# Patient Record
Sex: Female | Born: 1980 | Race: Black or African American | Hispanic: No | Marital: Married | Smoking: Never smoker
Health system: Southern US, Community
[De-identification: ages and names within clinical notes are randomized; demographics above are authoritative.]

## PROBLEM LIST (undated history)

## (undated) DIAGNOSIS — J45909 Unspecified asthma, uncomplicated: Secondary | ICD-10-CM

## (undated) DIAGNOSIS — I10 Essential (primary) hypertension: Secondary | ICD-10-CM

## (undated) DIAGNOSIS — F5102 Adjustment insomnia: Secondary | ICD-10-CM

## (undated) DIAGNOSIS — Z1231 Encounter for screening mammogram for malignant neoplasm of breast: Secondary | ICD-10-CM

---

## 2017-02-21 ENCOUNTER — Inpatient Hospital Stay
Admission: EM | Admit: 2017-02-21 | Discharge: 2017-02-24 | DRG: 776 | Disposition: A | Discharge status: Home / Self Care | Payer: BC Managed Care – PPO | Attending: Obstetrics and Gynecology | Admitting: Obstetrics and Gynecology

## 2017-02-21 DIAGNOSIS — O1415 Severe pre-eclampsia, complicating the puerperium: Secondary | ICD-10-CM | POA: Diagnosis not present

## 2017-02-21 DIAGNOSIS — O165 Unspecified maternal hypertension, complicating the puerperium: Secondary | ICD-10-CM | POA: Diagnosis present

## 2017-02-21 DIAGNOSIS — R030 Elevated blood-pressure reading, without diagnosis of hypertension: Secondary | ICD-10-CM | POA: Diagnosis not present

## 2017-02-21 DIAGNOSIS — O1495 Unspecified pre-eclampsia, complicating the puerperium: Secondary | ICD-10-CM

## 2017-02-21 LAB — PROTEIN / CREATININE RATIO, URINE: CREATININE, URINE: 42 mg/dL

## 2017-02-21 LAB — COMPREHENSIVE METABOLIC PANEL
ALT: 15 U/L (ref 14–54)
ANION GAP: 12 (ref 5–15)
AST: 15 U/L (ref 15–41)
Albumin: 2.8 g/dL — ABNORMAL LOW (ref 3.5–5.0)
Alkaline Phosphatase: 107 U/L (ref 38–126)
BUN: 9 mg/dL (ref 6–20)
CHLORIDE: 107 mmol/L (ref 101–111)
CO2: 21 mmol/L — ABNORMAL LOW (ref 22–32)
Calcium: 8.6 mg/dL — ABNORMAL LOW (ref 8.9–10.3)
Creatinine, Ser: 0.66 mg/dL (ref 0.44–1.00)
Glucose, Bld: 103 mg/dL — ABNORMAL HIGH (ref 65–99)
POTASSIUM: 3.2 mmol/L — AB (ref 3.5–5.1)
Sodium: 140 mmol/L (ref 135–145)
Total Bilirubin: 0.3 mg/dL (ref 0.3–1.2)
Total Protein: 6.6 g/dL (ref 6.5–8.1)

## 2017-02-21 LAB — CBC
HCT: 32.8 % — ABNORMAL LOW (ref 35.0–47.0)
Hemoglobin: 11.2 g/dL — ABNORMAL LOW (ref 12.0–16.0)
MCH: 32.7 pg (ref 26.0–34.0)
MCHC: 34.2 g/dL (ref 32.0–36.0)
MCV: 95.8 fL (ref 80.0–100.0)
PLATELETS: 312 10*3/uL (ref 150–440)
RBC: 3.43 MIL/uL — ABNORMAL LOW (ref 3.80–5.20)
RDW: 14.1 % (ref 11.5–14.5)
WBC: 9.5 10*3/uL (ref 3.6–11.0)

## 2017-02-21 MED ORDER — HYDRALAZINE HCL 20 MG/ML IJ SOLN
5.0000 mg | Freq: Once | INTRAMUSCULAR | Status: DC
  Filled 2017-02-21: qty 1

## 2017-02-21 MED ORDER — KETOROLAC TROMETHAMINE 30 MG/ML IJ SOLN
30.0000 mg | Freq: Three times a day (TID) | INTRAMUSCULAR | Status: DC | PRN
  Administered 2017-02-22: 30 mg via INTRAVENOUS
  Filled 2017-02-21: qty 1

## 2017-02-21 MED ORDER — LABETALOL HCL 5 MG/ML IV SOLN
20.0000 mg | INTRAVENOUS | Status: DC | PRN
  Administered 2017-02-22: 20 mg via INTRAVENOUS
  Filled 2017-02-21: qty 4
  Filled 2017-02-21: qty 8

## 2017-02-21 MED ORDER — HYDRALAZINE HCL 20 MG/ML IJ SOLN
10.0000 mg | Freq: Once | INTRAMUSCULAR | Status: AC
  Administered 2017-02-21: 10 mg via INTRAVENOUS

## 2017-02-21 MED ORDER — ACETAMINOPHEN 325 MG PO TABS
650.0000 mg | ORAL_TABLET | ORAL | Status: DC | PRN
  Administered 2017-02-21 – 2017-02-22 (×2): 650 mg via ORAL
  Filled 2017-02-21 (×2): qty 2

## 2017-02-21 MED ORDER — MAGNESIUM SULFATE 40 G IN LACTATED RINGERS - SIMPLE
2.0000 g/h | INTRAVENOUS | Status: DC
  Administered 2017-02-21 – 2017-02-22 (×2): 2 g/h via INTRAVENOUS

## 2017-02-21 MED ORDER — LABETALOL HCL 5 MG/ML IV SOLN
20.0000 mg | Freq: Once | INTRAVENOUS | Status: AC
  Administered 2017-02-21: 20 mg via INTRAVENOUS
  Filled 2017-02-21: qty 4

## 2017-02-21 MED ORDER — ALBUTEROL SULFATE (2.5 MG/3ML) 0.083% IN NEBU: 2.5000 mg | INHALATION_SOLUTION | Freq: Four times a day (QID) | RESPIRATORY_TRACT | Status: DC | PRN

## 2017-02-21 MED ORDER — MAGNESIUM SULFATE BOLUS VIA INFUSION
4.0000 g | Freq: Once | INTRAVENOUS | Status: AC
  Administered 2017-02-21: 4 g via INTRAVENOUS
  Filled 2017-02-21: qty 500

## 2017-02-21 MED ORDER — LABETALOL HCL 5 MG/ML IV SOLN
20.0000 mg | Freq: Once | INTRAVENOUS | Status: AC
  Administered 2017-02-22: 20 mg via INTRAVENOUS
  Filled 2017-02-21: qty 4

## 2017-02-21 MED ORDER — LABETALOL HCL 5 MG/ML IV SOLN: 20.0000 mg | INTRAVENOUS | Status: DC | PRN

## 2017-02-21 MED ORDER — MAGNESIUM SULFATE 40 G IN LACTATED RINGERS - SIMPLE
2.0000 g/h | INTRAVENOUS | Status: DC
  Administered 2017-02-21: 2 g/h via INTRAVENOUS
  Filled 2017-02-21: qty 500

## 2017-02-21 MED ORDER — LABETALOL HCL 5 MG/ML IV SOLN: 10.0000 mg | Freq: Once | INTRAVENOUS | Status: DC

## 2017-02-21 NOTE — ED Provider Notes (Signed)
[facility] Emergency Department Provider Note  ____________________________________________  Time seen: Approximately 6:57 PM  I have reviewed the triage vital signs and the nursing notes.   HISTORY  Chief Complaint Hypertension   HPI Joneen BoersValencia G Bill is a 36 y.o. female G1P1 now 8 days pot c-section complicated by pre-eclampsia with severe features requiring 24 hours of IV magnesium who presents for evaluation of elevated blood pressure. Patient denies any prior history of high blood pressure. She has been started on amlodipine postdelivery and had hydrochlorothiazide added yesterday. She reports that her blood pressure has been persistently elevated and trending up since she went home from the hospital. Today systolics in the 160s has been the highest it ever been. She called her ob who recommended patient come to the ED for further evaluation. Patient denies HA, visual changes or floaters, abdominal pain.   History reviewed. No pertinent past medical history.  There are no active problems to display for this patient.   History reviewed. No pertinent surgical history.  Prior to Admission medications   Not on File    Allergies Patient has no known allergies.  No family history on file.  Social History Social History  Substance Use Topics  . Smoking status: Not on file  . Smokeless tobacco: Not on file  . Alcohol use Not on file    Review of Systems  Constitutional: Negative for fever. + elevated BP Eyes: Negative for visual changes. ENT: Negative for sore throat. Neck: No neck pain  Cardiovascular: Negative for chest pain. Respiratory: Negative for shortness of breath. Gastrointestinal: Negative for abdominal pain, vomiting or diarrhea. Genitourinary: Negative for dysuria. Musculoskeletal: Negative for back pain. Skin: Negative for rash. Neurological: Negative for headaches, weakness or numbness. Psych: No SI or  HI  ____________________________________________   PHYSICAL EXAM:  VITAL SIGNS: ED Triage Vitals  Enc Vitals Group     BP 02/21/17 1714 (!) 176/98     Pulse Rate 02/21/17 1714 (!) 113     Resp 02/21/17 1714 18     Temp 02/21/17 1714 98.2 F (36.8 C)     Temp Source 02/21/17 1714 Oral     SpO2 02/21/17 1714 99 %     Weight 02/21/17 1715 300 lb (136.1 kg)     Height 02/21/17 1715 5\' 5"  (1.651 m)     Head Circumference --      Peak Flow --      Pain Score --      Pain Loc --      Pain Edu? --      Excl. in GC? --     Constitutional: Alert and oriented. Well appearing and in no apparent distress. HEENT:      Head: Normocephalic and atraumatic.         Eyes: Conjunctivae are normal. Sclera is non-icteric.       Mouth/Throat: Mucous membranes are moist.       Neck: Supple with no signs of meningismus. Cardiovascular: Tachycardic with regular rhythm. No murmurs, gallops, or rubs. 2+ symmetrical distal pulses are present in all extremities. No JVD. Respiratory: Normal respiratory effort. Lungs are clear to auscultation bilaterally. No wheezes, crackles, or rhonchi.  Gastrointestinal: Soft, non tender, and non distended with positive bowel sounds. No rebound or guarding. Musculoskeletal: Nontender with normal range of motion in all extremities. No edema, cyanosis, or erythema of extremities. Neurologic: Normal speech and language. Face is symmetric. Moving all extremities. No gross focal neurologic deficits are appreciated. Skin:  Skin is warm, dry and intact. No rash noted. Psychiatric: Mood and affect are normal. Speech and behavior are normal.  ____________________________________________   LABS (all labs ordered are listed, but only abnormal results are displayed)  Labs Reviewed  CBC - Abnormal; Notable for the following:       Result Value   RBC 3.43 (*)    Hemoglobin 11.2 (*)    HCT 32.8 (*)    All other components within normal limits  COMPREHENSIVE METABOLIC PANEL -  Abnormal; Notable for the following:    Potassium 3.2 (*)    CO2 21 (*)    Glucose, Bld 103 (*)    Calcium 8.6 (*)    Albumin 2.8 (*)    All other components within normal limits  PROTEIN / CREATININE RATIO, URINE   ____________________________________________  EKG  none ____________________________________________  RADIOLOGY  none  ____________________________________________   PROCEDURES  Procedure(s) performed: None Procedures Critical Care performed:  Yes   CRITICAL CARE Performed by: Nita Sickle  ?  Total critical care time: 35 min  Critical care time was exclusive of separately billable procedures and treating other patients.  Critical care was necessary to treat or prevent imminent or life-threatening deterioration.  Critical care was time spent personally by me on the following activities: development of treatment plan with patient and/or surrogate as well as nursing, discussions with consultants, evaluation of patient's response to treatment, examination of patient, obtaining history from patient or surrogate, ordering and performing treatments and interventions, ordering and review of laboratory studies, ordering and review of radiographic studies, pulse oximetry and re-evaluation of patient's condition.  ____________________________________________   INITIAL IMPRESSION / ASSESSMENT AND PLAN / ED COURSE  36 y.o. female G1P1 now 8 days pot c-section complicated by pre-eclampsia with severe features requiring 24 hours of IV magnesium who presents for evaluation of elevated blood pressure.labs showing no evidence of help syndrome. The patient is neurologically intact at this time. Her blood pressure is 176/98. I will give her 20 mg of IV labetalol since patient is also tachycardic with a pulse of 113, start patient on mag bolus and drip. I discussed with Dr. Feliberto Gottron who will admit patient to L&D for further management of pre-eclampsia       As  part of my medical decision making, I reviewed the following data within the electronic MEDICAL RECORD NUMBER Nursing notes reviewed and incorporated, Labs reviewed , Old chart reviewed, Discussed with admitting physician , Notes from prior ED visits and [place] Controlled Substance Database    Pertinent labs & imaging results that were available during my care of the patient were reviewed by me and considered in my medical decision making (see chart for details).    ____________________________________________   FINAL CLINICAL IMPRESSION(S) / ED DIAGNOSES  Final diagnoses:  Pre-eclampsia in postpartum period      NEW MEDICATIONS STARTED DURING THIS VISIT:  New Prescriptions   No medications on file     Note:  This document was prepared using Dragon voice recognition software and may include unintentional dictation errors.    Nita Sickle, MD 02/21/17 [phone]

## 2017-02-21 NOTE — ED Triage Notes (Signed)
Pt had baby a week ago, states BP high in hospital after delivery. Pt to doctor for followup yesterday and BP still high and prescribed BP medications. Pt went back today and BP continues to be very elevated. Denies headache, blurry visison. Pt also c/o left sided hand swelling that she noticed today. Pt states she had IV infiltrate to left hand while in hospital, doctor concerned for blood clot. Pt alert and oriented X4, active, cooperative, pt in NAD. RR even and unlabored, color WNL.

## 2017-02-21 NOTE — H&P (Signed)
Regina Garcia is a 36 y.o. female presenting for elevated BP PP [facility]  8 days previously Regina Garcia is a 36 y.o. [id number] who is POD8 status post urgent pLTC-section at [id number] for non reassuring FHR   Her pregnancy was complicated by asthma (mild, persistent), obesity (BMI 54), cHTN with superimposed prex with severe features, AMA.   Delivery complicated by hypotension requiring phenylephrine, with low APGARs in neonate.She underwent 24 hrs pp mag for superimposed PreE.  Sh ef/up with her doctors 4 days ago for BP check and procardia xl was switched to Amlodipine 5 mg and 12.5 HCTZ . On recheck today BP still elevated that prompted an ED visit  She denies Vision change and no H/A. Baby is doing well . Mother just started breast feeding today      OB History    No data available     PMHX : prior syncopal episode 2 yrs ago with negative w/up . Obesity . Asthma. Pshx LTCS   Family History:+ HTN , cva , DM    Social History:  has no tobacco, alcohol, and drug history on file.  Allergies : NKDA   Medication :  Motrin 800 mg q 8 hrs prn pain  Amlodipine 5 mg q d  lovenox 60 mg sq daily  HCTZ 12.5 mg daily      ROS  general : + Obesity HEENT : no hearing loss, no vision change  Endocrine : thyroid disease , weight loss CV : no chest pain  Pulm ; + asthma  GI : no diarrhea , constipation  GU: no pelvic infection  M/s : no weakness Neuro : no seizure activity + syncopal episode   History   Blood pressure (!) 162/90, pulse 81, temperature 98.2 F (36.8 C), temperature source Oral, resp. rate 18, height 5\' 5"  (1.651 m), weight 136.1 kg (300 lb), SpO2 100 %. Exam WDWN obese black female in NAD  Max BP in ED 176/98   Physical Exam   Exam WDWN obese black female in NAD   perrla  Lungs CTA  CV RRR  Abd : soft Nt  Incision healing well , no d/c or erythema  Pelvic deferred  Neuro : 4+/4 patella dtR   Results for orders placed or performed during the  hospital encounter of 02/21/17 (from the past 24 hour(s))  CBC     Status: Abnormal   Collection Time: 02/21/17  5:22 PM  Result Value Ref Range   WBC 9.5 3.6 - 11.0 K/uL   RBC 3.43 (L) 3.80 - 5.20 MIL/uL   Hemoglobin 11.2 (L) 12.0 - 16.0 g/dL   HCT 45.432.8 (L) 09.835.0 - 11.947.0 %   MCV 95.8 80.0 - 100.0 fL   MCH 32.7 26.0 - 34.0 pg   MCHC 34.2 32.0 - 36.0 g/dL   RDW 14.714.1 82.911.5 - 56.214.5 %   Platelets 312 150 - 440 K/uL  Comprehensive metabolic panel     Status: Abnormal   Collection Time: 02/21/17  5:22 PM  Result Value Ref Range   Sodium 140 135 - 145 mmol/L   Potassium 3.2 (L) 3.5 - 5.1 mmol/L   Chloride 107 101 - 111 mmol/L   CO2 21 (L) 22 - 32 mmol/L   Glucose, Bld 103 (H) 65 - 99 mg/dL   BUN 9 6 - 20 mg/dL   Creatinine, Ser 1.300.66 0.44 - 1.00 mg/dL   Calcium 8.6 (L) 8.9 - 10.3 mg/dL   Total Protein 6.6  6.5 - 8.1 g/dL   Albumin 2.8 (L) 3.5 - 5.0 g/dL   AST 15 15 - 41 U/L   ALT 15 14 - 54 U/L   Alkaline Phosphatase 107 38 - 126 U/L   Total Bilirubin 0.3 0.3 - 1.2 mg/dL   GFR calc non Af Amer >60 >60 mL/min   GFR calc Af Amer >60 >60 mL/min   Anion gap 12 5 - 15  Protein / creatinine ratio, urine     Status: None   Collection Time: 02/21/17  5:22 PM  Result Value Ref Range   Creatinine, Urine 42 mg/dL   Total Protein, Urine <6 mg/dL   Protein Creatinine Ratio        0.00 - 0.15 mg/mg[Cre]   Assessment/Plan: PP Hypertension . Normal preeclampsia labs. BP places her at CVA risks .  Admit to Birthplace and place on Magnesium sulfate to prevent seizure activity  Hydralazine     Ihor Austin Kindle Strohmeier 02/21/2017, 8:31 PM

## 2017-02-22 LAB — CBC
HEMATOCRIT: 31.9 % — AB (ref 35.0–47.0)
Hemoglobin: 10.9 g/dL — ABNORMAL LOW (ref 12.0–16.0)
MCH: 32.4 pg (ref 26.0–34.0)
MCHC: 34 g/dL (ref 32.0–36.0)
MCV: 95.1 fL (ref 80.0–100.0)
PLATELETS: 332 10*3/uL (ref 150–440)
RBC: 3.35 MIL/uL — ABNORMAL LOW (ref 3.80–5.20)
RDW: 14.3 % (ref 11.5–14.5)
WBC: 8.5 10*3/uL (ref 3.6–11.0)

## 2017-02-22 LAB — COMPREHENSIVE METABOLIC PANEL
ALT: 12 U/L — AB (ref 14–54)
AST: 13 U/L — AB (ref 15–41)
Albumin: 2.6 g/dL — ABNORMAL LOW (ref 3.5–5.0)
Alkaline Phosphatase: 107 U/L (ref 38–126)
Anion gap: 7 (ref 5–15)
BILIRUBIN TOTAL: 0.5 mg/dL (ref 0.3–1.2)
BUN: 7 mg/dL (ref 6–20)
CO2: 25 mmol/L (ref 22–32)
CREATININE: 0.68 mg/dL (ref 0.44–1.00)
Calcium: 8.1 mg/dL — ABNORMAL LOW (ref 8.9–10.3)
Chloride: 108 mmol/L (ref 101–111)
Glucose, Bld: 101 mg/dL — ABNORMAL HIGH (ref 65–99)
POTASSIUM: 3.2 mmol/L — AB (ref 3.5–5.1)
Sodium: 140 mmol/L (ref 135–145)
TOTAL PROTEIN: 6.5 g/dL (ref 6.5–8.1)

## 2017-02-22 LAB — PROTEIN / CREATININE RATIO, URINE: Creatinine, Urine: 41 mg/dL

## 2017-02-22 MED ORDER — CALCIUM GLUCONATE 10 % IV SOLN
INTRAVENOUS | Status: AC
Start: 2017-02-22 — End: 2017-02-22
  Filled 2017-02-22: qty 10

## 2017-02-22 MED ORDER — ACETAMINOPHEN 325 MG PO TABS
650.0000 mg | ORAL_TABLET | ORAL | Status: DC | PRN
  Administered 2017-02-22 – 2017-02-24 (×6): 650 mg via ORAL
  Filled 2017-02-22 (×5): qty 2

## 2017-02-22 MED ORDER — OXYCODONE HCL 5 MG PO TABS
ORAL_TABLET | ORAL | Status: AC
  Filled 2017-02-22: qty 1

## 2017-02-22 MED ORDER — COCONUT OIL OIL: 1.0000 "application " | TOPICAL_OIL | Status: DC | PRN

## 2017-02-22 MED ORDER — DIPHENHYDRAMINE HCL 25 MG PO CAPS: 25.0000 mg | ORAL_CAPSULE | Freq: Four times a day (QID) | ORAL | Status: DC | PRN

## 2017-02-22 MED ORDER — SODIUM CHLORIDE FLUSH 0.9 % IV SOLN
INTRAVENOUS | Status: AC
  Filled 2017-02-22: qty 10

## 2017-02-22 MED ORDER — ACETAMINOPHEN 325 MG PO TABS
ORAL_TABLET | ORAL | Status: AC
Start: 2017-02-22 — End: 2017-02-23
  Filled 2017-02-22: qty 2

## 2017-02-22 MED ORDER — IBUPROFEN 600 MG PO TABS: 600.0000 mg | ORAL_TABLET | Freq: Four times a day (QID) | ORAL | Status: DC

## 2017-02-22 MED ORDER — MEASLES, MUMPS & RUBELLA VAC ~~LOC~~ INJ: 0.5000 mL | INJECTION | Freq: Once | SUBCUTANEOUS | Status: DC

## 2017-02-22 MED ORDER — OXYCODONE HCL 5 MG PO TABS
5.0000 mg | ORAL_TABLET | Freq: Once | ORAL | Status: AC | PRN
  Administered 2017-02-22: 5 mg via ORAL

## 2017-02-22 MED ORDER — ONDANSETRON HCL 4 MG/2ML IJ SOLN: 4.0000 mg | INTRAMUSCULAR | Status: DC | PRN

## 2017-02-22 MED ORDER — OXYCODONE HCL 5 MG PO TABS
5.0000 mg | ORAL_TABLET | Freq: Four times a day (QID) | ORAL | Status: AC | PRN
  Administered 2017-02-22: 5 mg via ORAL
  Filled 2017-02-22: qty 1

## 2017-02-22 MED ORDER — OXYCODONE HCL 5 MG PO TABS
5.0000 mg | ORAL_TABLET | Freq: Four times a day (QID) | ORAL | Status: AC | PRN
  Administered 2017-02-23 (×2): 5 mg via ORAL
  Filled 2017-02-22 (×2): qty 1

## 2017-02-22 MED ORDER — MAGNESIUM SULFATE 50 % IJ SOLN
INTRAVENOUS | Status: AC
  Filled 2017-02-22: qty 80

## 2017-02-22 MED ORDER — PRENATAL MULTIVITAMIN CH
1.0000 | ORAL_TABLET | Freq: Every day | ORAL | Status: DC
  Administered 2017-02-22 – 2017-02-23 (×2): 1 via ORAL
  Filled 2017-02-22 (×2): qty 1

## 2017-02-22 MED ORDER — SODIUM CHLORIDE 0.9% FLUSH
10.0000 mL | Freq: Two times a day (BID) | INTRAVENOUS | Status: DC
  Administered 2017-02-22 – 2017-02-24 (×3): 10 mL via INTRAVENOUS
  Filled 2017-02-22: qty 10

## 2017-02-22 MED ORDER — DIBUCAINE 1 % RE OINT: 1.0000 "application " | TOPICAL_OINTMENT | RECTAL | Status: DC | PRN

## 2017-02-22 MED ORDER — LACTATED RINGERS IV SOLN
INTRAVENOUS | Status: DC
  Administered 2017-02-22 – 2017-02-23 (×2): via INTRAVENOUS

## 2017-02-22 MED ORDER — FERROUS SULFATE 325 (65 FE) MG PO TABS
325.0000 mg | ORAL_TABLET | Freq: Two times a day (BID) | ORAL | Status: DC
  Administered 2017-02-22 – 2017-02-24 (×4): 325 mg via ORAL
  Filled 2017-02-22 (×4): qty 1

## 2017-02-22 MED ORDER — IBUPROFEN 600 MG PO TABS
ORAL_TABLET | ORAL | Status: AC
  Filled 2017-02-22: qty 1

## 2017-02-22 MED ORDER — SENNOSIDES-DOCUSATE SODIUM 8.6-50 MG PO TABS
2.0000 | ORAL_TABLET | ORAL | Status: DC
  Administered 2017-02-23 – 2017-02-24 (×2): 2 via ORAL
  Filled 2017-02-22 (×3): qty 2

## 2017-02-22 MED ORDER — MAGNESIUM HYDROXIDE 400 MG/5ML PO SUSP
30.0000 mL | ORAL | Status: DC | PRN
  Filled 2017-02-22: qty 30

## 2017-02-22 MED ORDER — SIMETHICONE 80 MG PO CHEW
80.0000 mg | CHEWABLE_TABLET | ORAL | Status: DC | PRN
  Filled 2017-02-22: qty 1

## 2017-02-22 MED ORDER — ONDANSETRON HCL 4 MG PO TABS: 4.0000 mg | ORAL_TABLET | ORAL | Status: DC | PRN

## 2017-02-22 MED ORDER — NIFEDIPINE ER 60 MG PO TB24
60.0000 mg | ORAL_TABLET | Freq: Every day | ORAL | Status: DC
  Administered 2017-02-22 – 2017-02-24 (×3): 60 mg via ORAL
  Filled 2017-02-22 (×4): qty 1

## 2017-02-22 MED ORDER — BREAST MILK
ORAL | Status: DC
  Filled 2017-02-22: qty 1

## 2017-02-22 MED ORDER — ZOLPIDEM TARTRATE 5 MG PO TABS
5.0000 mg | ORAL_TABLET | Freq: Every evening | ORAL | Status: DC | PRN
  Administered 2017-02-22 – 2017-02-23 (×3): 5 mg via ORAL
  Filled 2017-02-22 (×3): qty 1

## 2017-02-22 MED ORDER — WITCH HAZEL-GLYCERIN EX PADS: 1.0000 "application " | MEDICATED_PAD | CUTANEOUS | Status: DC | PRN

## 2017-02-22 MED ORDER — HYDROCHLOROTHIAZIDE 25 MG PO TABS
25.0000 mg | ORAL_TABLET | Freq: Every day | ORAL | Status: DC
  Administered 2017-02-23 – 2017-02-24 (×2): 25 mg via ORAL
  Filled 2017-02-22 (×3): qty 1

## 2017-02-22 MED ORDER — ACETAMINOPHEN 500 MG PO TABS
1000.0000 mg | ORAL_TABLET | Freq: Four times a day (QID) | ORAL | Status: AC | PRN
  Administered 2017-02-22: 1000 mg via ORAL
  Filled 2017-02-22: qty 2

## 2017-02-22 MED ORDER — BENZOCAINE-MENTHOL 20-0.5 % EX AERO: 1.0000 "application " | INHALATION_SPRAY | CUTANEOUS | Status: DC | PRN

## 2017-02-22 MED ORDER — ENOXAPARIN SODIUM 60 MG/0.6ML ~~LOC~~ SOLN
60.0000 mg | SUBCUTANEOUS | Status: DC
  Filled 2017-02-22: qty 0.6

## 2017-02-22 MED ORDER — ENOXAPARIN SODIUM 60 MG/0.6ML ~~LOC~~ SOLN
60.0000 mg | SUBCUTANEOUS | Status: DC
  Administered 2017-02-22 – 2017-02-24 (×3): 60 mg via SUBCUTANEOUS
  Filled 2017-02-22 (×3): qty 0.6

## 2017-02-22 NOTE — Progress Notes (Signed)
Spoke to Schermerhorn to verify dose of lovenox 60 mg subq daily that was placed.  MD wants to VTE prophylax w/ lovenox 60 mg subq daily.  Will start lovenox 60 mg subq daily for VTE prophylaxis.  Thomasene Rippleavid Dima Mini, PharmD, BCPS Clinical Pharmacist 02/22/2017

## 2017-02-22 NOTE — Progress Notes (Signed)
Patient ID: Regina Garcia, female   DOB: Jun 26, 1980, 36 y.o.   MRN: [id number]  Vitals:   02/22/17 1105 02/22/17 1210  BP: (!) 148/94 (!) 151/96  Pulse: 89 98  Resp: (!) 22 20  Temp:  98.6 F (37 C)  SpO2: 97% 98%   Doing well. No SOB, NAD  - continue mag x24hrs

## 2017-02-22 NOTE — Progress Notes (Addendum)
Regina Garcia is a 36 y.o. now POD#9 from pLTCS for NRFHT at 39+5wks, who presented overnight with severe range BP, and is currently on magnesium. S/p several doses of iv meds on admission, none since.  Subjective: - Headache, 5/10, no hx of chronic headaches since childhood after head injury. S/p MRI 2 yrs ago of head that was normal. Treated with oxycodone with some relief 3 hrs ago. Started with mag in the ER, was not present on admission - No SOB, CP - good urine output   Objective: BP (!) 153/91 (BP Location: Left Arm)   Pulse 92   Temp 98.3 F (36.8 C) (Oral)   Resp 20   Ht 5\' 5"  (1.651 m)   Wt 136.1 kg (300 lb)   SpO2 98%   BMI 49.92 kg/m  I/O last 3 completed shifts: In: 240 [P.O.:240] Out: 1450 [Urine:1450] Total I/O In: 240 [P.O.:240] Out: 225 [Urine:225]  Labs: Lab Results  Component Value Date   WBC 8.5 02/22/2017   HGB 10.9 (L) 02/22/2017   HCT 31.9 (L) 02/22/2017   MCV 95.1 02/22/2017   PLT 332 02/22/2017    Assessment / Plan: - Severe range BP postpartum, s/p treatment postpartum  - continue mag x24hrs - iv meds per protocol - will start po meds prn- consider HCTZ, nifedipine after mag finished - Breastfeeding: lactation consult - BMI>50: continue 60mg  sq lovenox for dvt ppx x6 weeks postpartum - regular diet  Christeen DouglasBethany Maryn Freelove 02/22/2017, 9:21 AM

## 2017-02-22 NOTE — Progress Notes (Signed)
Patient ID: Regina Garcia, female   DOB: 1981/04/22, 36 y.o.   MRN: [id number]  Pt c/o 8/10 headache, not relieved by meds including oxycodone for more than 2 hours. I was concerned about cerebral pathology. However, she did confirm that she started having this headache with magnesium on admission. Her BP has not been elevated to severe range since admission. No neuro sx. No visual changes.   I stopped the magnesium (8 days postpartum, s/p 24hr of magnesium after delivery) to see if improvement, and indeed her headache is now 1/10.   I will stop her amlodipine 5mg  as she was taking religiously after delivery. Will start HCTZ 25mg  and nifedipine XL 60mg . After monitoring for two more hours, will d/c foley and scds and walk her to mother baby.

## 2017-02-23 NOTE — Progress Notes (Signed)
Pt. Assessment completed. Very pleasant. Pt's last blood pressure was 158/95 and pulse 100. Will continue to monitor throughout shift.   Jodean LimaSolange Helmi Hechavarria, Charity fundraiserN.

## 2017-02-23 NOTE — Progress Notes (Signed)
Subjective: Postpartum Day 10: Cesarean Delivery Patient reports headache, which was a concern during mag administration.   Relieved with meds now. Baby with patient in room. BP not in severe range since coming off mag. Received 60mg  procardia XL last night at bedtime. Will get 25mg  HCTZ this morning.  Hx of asthma, no sx currently.  Objective: Vital signs in last 24 hours: Temp:  [98 F (36.7 C)-98.6 F (37 C)] 98.2 F (36.8 C) (10/26 0809) Pulse Rate:  [88-100] 100 (10/26 0809) Resp:  [16-22] 22 (10/26 0809) BP: (138-172)/(72-98) 158/94 (10/26 0809) SpO2:  [96 %-100 %] 99 % (10/26 0809)  Physical Exam:  General: alert, cooperative and appears stated age no swelling CV: RRR Pulm: CTAB  Recent Labs  02/21/17 1722 02/22/17 0547  HGB 11.2* 10.9*  HCT 32.8* 31.9*    Assessment/Plan: - BP remaining stable, but elevated. Will increase procardia in am. - now po diet - recommend f/u in 3 days for BP check. - headache relieved by meds  Christeen DouglasBethany Janeen Watson 02/23/2017, 8:58 AM

## 2017-02-23 NOTE — Lactation Note (Signed)
Lactation Consultation Note  Patient Name: Regina BoersValencia G Tolliver UJWJX'BToday's Date: 02/23/2017  Pt has not breastfed baby but once since delivery 10 days ago.  This is pt's first baby.  Attempted today without help,  but baby did not latch well, Pt has been tx'd with MgSO4 at c/s  [facility] and  here on admission.  Has been pumping breasts since delivery and milk production has recently started increasing, can now pump  2 oz EBM at a pumping session, she is currently leaking breastmilk while changing her baby's diaper, had given baby 40 cc EBM 1 hr ago, I assisted pt with latching baby to breast after first showing pt how to hand express breastmilk to make latch easier.  Baby latched well with compression of left breast tissue and nursed welll for approx. 10 min, nursed well on right breast for approx. 5 min. I encouraged her to nurse baby frequently while baby is visiting to practice latching baby and may also pump breasts after nursing if she feels breasts are still full after nursing.  She expressed satisfaction about breastfeeding after this nursing session      Maternal Data    Feeding    LATCH Score                   Interventions    Lactation Tools Discussed/Used     Consult Status      Regina Garcia 02/23/2017, 2:02 PM

## 2017-02-24 MED ORDER — NIFEDIPINE ER 60 MG PO TB24: 60.0000 mg | ORAL_TABLET | Freq: Every day | ORAL | 1 refills | Status: AC

## 2017-02-24 NOTE — Discharge Summary (Signed)
Gynecology Physician Postoperative Discharge Summary  Patient ID: Regina Garcia MRN: [id number] DOB/AGE: [birth year] 36 y.o.  Admit Date: 02/21/2017 Discharge Date: 02/24/2017  Admission Diagnoses: postpartum preeclampsia with severe features (BP) Discharge Diagnoses: same  Procedures:   CBC Latest Ref Rng & Units 02/22/2017 02/21/2017  WBC 3.6 - 11.0 K/uL 8.5 9.5  Hemoglobin 12.0 - 16.0 g/dL 10.9(L) 11.2(L)  Hematocrit 35.0 - 47.0 % 31.9(L) 32.8(L)  Platelets 150 - 440 K/uL 332 312    Hospital Course:  Regina Garcia is a 36 y.o. Post-op day 8 from LTCS, who presented due to hand swelling and kept for severe-range BPs requiring IV anti-hypertensives.  She was chronic HTN in pregnancy.  She was given Magnesium sulfate and oral anti-hypertensives.  She was kept under observation while the proper dose was adjusted.  On HD# 4  She was deemed stable for discharge to home.   Discharge Exam: Blood pressure (!) 149/72, pulse 87, temperature 98.3 F (36.8 C), temperature source Oral, resp. rate 18, height 5\' 5"  (1.651 m), weight 136.1 kg (300 lb), SpO2 98 %. General appearance: alert and no distress  Resp: clear to auscultation bilaterally, normal respiratory effort Cardio: regular rate and rhythm  GI: soft, non-tender; bowel sounds normal; no masses, no organomegaly.  Incision: C/D/I, no erythema, no drainage noted Extremities: extremities normal, atraumatic, no cyanosis or edema and Homans sign is negative, no sign of DVT  Discharged Condition: Stable  Disposition: stable for d/c home  Discharge Instructions    Activity as tolerated    Complete by:  As directed    Call MD for:  difficulty breathing, headache or visual disturbances    Complete by:  As directed    Call MD for:  persistant dizziness or light-headedness    Complete by:  As directed    Call MD for:  persistant nausea and vomiting    Complete by:  As directed    Call MD for:  redness, tenderness, or signs of  infection (pain, swelling, redness, odor or green/yellow discharge around incision site)    Complete by:  As directed    Call MD for:  severe uncontrolled pain    Complete by:  As directed    Diet general    Complete by:  As directed    Discharge wound care:    Complete by:  As directed    Keep incision dry, clean.   Driving restriction    Complete by:  As directed    Avoid driving for at least 1 weeks. Do not drive on narcotics.   Lifting restrictions    Complete by:  As directed    Weight restriction of 10 lbs. May lift/carry baby.   Sexual acrtivity    Complete by:  As directed    Please refrain from intercourse until after you are cleared by your doctor/midwife at your 6 week visit.     Allergies as of 02/24/2017   No Known Allergies     Medication List    STOP taking these medications   amLODipine 5 MG tablet Commonly known as:  NORVASC     TAKE these medications   acetaminophen 325 MG tablet Commonly known as:  TYLENOL Take 975 mg by mouth every 6 (six) hours as needed for mild pain or headache.   enoxaparin 60 MG/0.6ML injection Commonly known as:  LOVENOX Inject 60 mg into the skin daily.   hydrochlorothiazide 12.5 MG tablet Commonly known as:  HYDRODIURIL Take 12.5 mg by mouth  daily.   ibuprofen 600 MG tablet Commonly known as:  ADVIL,MOTRIN Take 600 mg by mouth every 6 (six) hours as needed for headache or mild pain.   NIFEdipine 60 MG 24 hr tablet Commonly known as:  PROCARDIA-XL/ADALAT CC Take 1 tablet (60 mg total) by mouth daily.   oxyCODONE 5 MG immediate release tablet Commonly known as:  Oxy IR/ROXICODONE Take 5 mg by mouth every 4 (four) hours as needed for moderate pain.            Discharge Care Instructions        Start     Ordered   02/24/17 0000  Discharge wound care:    Comments:  Keep incision dry, clean.   02/24/17 1153     Follow-up Information    Jamani Eley, Elenora Fenderhelsea C, MD Follow up in 1 week(s).   Specialty:  Obstetrics  and Gynecology Why:  Friday Nov 2 Contact information: [address] [place] [id number] [phone]           Signed:  Elenora Fenderhelsea C Nat Lowenthal Attending Obstetrician & Gynecologist [facility]

## 2017-02-24 NOTE — Discharge Instructions (Signed)
Postpartum Hypertension Postpartum hypertension is high blood pressure after pregnancy that remains higher than normal for more than two days after delivery. You may not realize that you have postpartum hypertension if your blood pressure is not being checked regularly. In some cases, postpartum hypertension will go away on its own, usually within a week of delivery. However, for some women, medical treatment is required to prevent serious complications, such as seizures or stroke. The following things can affect your blood pressure:  The type of delivery you had.  Having received IV fluids or other medicines during or after delivery.  What are the causes? Postpartum hypertension may be caused by any of the following or by a combination of any of the following:  Hypertension that existed before pregnancy (chronic hypertension).  Gestational hypertension.  Preeclampsia or eclampsia.  Receiving a lot of fluid through an IV during or after delivery.  Medicines.  HELLP syndrome.  Hyperthyroidism.  Stroke.  Other rare neurological or blood disorders.  In some cases, the cause may not be known. What increases the risk? Postpartum hypertension can be related to one or more risk factors, such as:  Chronic hypertension. In some cases, this may not have been diagnosed before pregnancy.  Obesity.  Type 2 diabetes.  Kidney disease.  Family history of preeclampsia.  Other medical conditions that cause hormonal imbalances.  What are the signs or symptoms? As with all types of hypertension, postpartum hypertension may not have any symptoms. Depending on how high your blood pressure is, you may experience:  Headaches. These may be mild, moderate, or severe. They may also be steady, constant, or sudden in onset (thunderclap headache).  Visual changes.  Dizziness.  Shortness of breath.  Swelling of your hands, feet, lower legs, or face. In some cases, you may have swelling  in more than one of these locations.  Heart palpitations or a racing heartbeat.  Difficulty breathing while lying down.  Decreased urination.  Other rare signs and symptoms may include:  Sweating more than usual. This lasts longer than a few days after delivery.  Chest pain.  Sudden dizziness when you get up from sitting or lying down.  Seizures.  Nausea or vomiting.  Abdominal pain.  How is this diagnosed? The diagnosis of postpartum hypertension is made through a combination of physical examination findings and testing of your blood and urine. You may also have additional tests, such as a CT scan or an MRI, to check for other complications of postpartum hypertension. How is this treated? When blood pressure is high enough to require treatment, your options may include:  Medicines to reduce blood pressure (antihypertensives). Tell your health care provider if you are breastfeeding or if you plan to breastfeed. There are many antihypertensive medicines that are safe to take while breastfeeding.  Stopping medicines that may be causing hypertension.  Treating medical conditions that are causing hypertension.  Treating the complications of hypertension, such as seizures, stroke, or kidney problems.  Your health care provider will also continue to monitor your blood pressure closely and repeatedly until it is within a safe range for you. Follow these instructions at home:  Take medicines only as directed by your health care provider.  Get regular exercise after your health care provider tells you that it is safe.  Follow your health care providers recommendations on fluid and salt restrictions.  Do not use any tobacco products, including cigarettes, chewing tobacco, or electronic cigarettes. If you need help quitting, ask your health care  provider.  Keep all follow-up visits as directed by your health care provider. This is important. Contact a health care provider  if:  Your symptoms get worse.  You have new symptoms, such as: ? Headache. ? Dizziness. ? Visual changes. Get help right away if:  You develop a severe or sudden headache.  You have seizures.  You develop numbness or weakness on one side of your body.  You have difficulty thinking, speaking, or swallowing.  You develop severe abdominal pain.  You develop difficulty breathing, chest pain, a racing heartbeat, or heart palpitations. These symptoms may represent a serious problem that is an emergency. Do not wait to see if the symptoms will go away. Get medical help right away. Call your local emergency services (911 in the [place] Do not drive yourself to the hospital. This information is not intended to replace advice given to you by your health care provider. Make sure you discuss any questions you have with your health care provider. Document Released: 12/19/2013 Document Revised: 09/20/2015 Document Reviewed: 10/30/2013 Elsevier Interactive Patient Education  2018 [employer] You have been prescribed a new medication, procardia (nifedipine) 60mg .  This is a once-a-day medication and can be decreased as your blood pressures start to normalize after delivery.   Please take your blood pressure a couple times daily and write them down.  We will use this to determine when we decrease your doses. Also take your blood pressure if you feel lightheaded or woozy.  Too high:  160 (top number), or 110 (bottom number).  If you get these numbers, recheck in 15 minutes.  Call if these values are persistent.  Follow up with Dr. Elesa MassedWard on Friday, Nov 2.  Always be on the lookout for changes of vision, pain in your upper right abdomen, or shortness of breath.

## 2017-02-24 NOTE — Progress Notes (Signed)
Discharge instructions reviewed with patient. Rx given to patient for blood pressure med. Patient verbalized understanding. Patient states she reminded to call office and follow up with Dr. Elesa MassedWard on Nov 2nd. Patient declined escort to car. Patient left the unit ambulatory- spouse driving patient home.

## 2017-07-27 ENCOUNTER — Emergency Department
Admission: EM | Admit: 2017-07-27 | Discharge: 2017-07-27 | Disposition: A | Discharge status: Home / Self Care | Payer: BC Managed Care – PPO | Attending: Emergency Medicine | Admitting: Emergency Medicine

## 2017-07-27 ENCOUNTER — Emergency Department: Payer: BC Managed Care – PPO

## 2017-07-27 DIAGNOSIS — M549 Dorsalgia, unspecified: Secondary | ICD-10-CM

## 2017-07-27 DIAGNOSIS — Y939 Activity, unspecified: Secondary | ICD-10-CM | POA: Insufficient documentation

## 2017-07-27 DIAGNOSIS — J45909 Unspecified asthma, uncomplicated: Secondary | ICD-10-CM | POA: Diagnosis not present

## 2017-07-27 DIAGNOSIS — Y999 Unspecified external cause status: Secondary | ICD-10-CM | POA: Diagnosis not present

## 2017-07-27 DIAGNOSIS — Y929 Unspecified place or not applicable: Secondary | ICD-10-CM | POA: Diagnosis not present

## 2017-07-27 DIAGNOSIS — S161XXA Strain of muscle, fascia and tendon at neck level, initial encounter: Secondary | ICD-10-CM

## 2017-07-27 DIAGNOSIS — S199XXA Unspecified injury of neck, initial encounter: Secondary | ICD-10-CM | POA: Diagnosis present

## 2017-07-27 HISTORY — DX: Unspecified asthma, uncomplicated: J45.909

## 2017-07-27 MED ORDER — KETOROLAC TROMETHAMINE 60 MG/2ML IM SOLN
60.0000 mg | Freq: Once | INTRAMUSCULAR | Status: AC
  Administered 2017-07-27: 60 mg via INTRAMUSCULAR
  Filled 2017-07-27: qty 2

## 2017-07-27 MED ORDER — DIAZEPAM 5 MG PO TABS
5.0000 mg | ORAL_TABLET | Freq: Once | ORAL | Status: AC
  Administered 2017-07-27: 5 mg via ORAL
  Filled 2017-07-27: qty 1

## 2017-07-27 MED ORDER — IBUPROFEN 800 MG PO TABS: 800.0000 mg | ORAL_TABLET | Freq: Three times a day (TID) | ORAL | 0 refills | Status: AC | PRN

## 2017-07-27 MED ORDER — DIAZEPAM 5 MG PO TABS: 5.0000 mg | ORAL_TABLET | Freq: Three times a day (TID) | ORAL | 0 refills | Status: AC | PRN

## 2017-07-27 MED ORDER — OXYCODONE-ACETAMINOPHEN 5-325 MG PO TABS: 1.0000 | ORAL_TABLET | ORAL | 0 refills | Status: AC | PRN
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
## 2017-07-27 NOTE — Discharge Instructions (Signed)
May take Tylenol or Motrin for mild to moderate pain and Percocet for severe pain.  Valium is for severe muscle spasms.  Do not drive within 8 hours of taking Percocet or Valium.  Do not take care of an infant by yourself when you are taking Percocet or Valium as these medications can make you sleepy.  Return to the emergency department if you develop severe pain, lightheadedness or fainting, vomiting, or any other symptoms concerning to you.

## 2017-07-27 NOTE — ED Triage Notes (Signed)
Patient reports numbness/tingling down right hand and leg
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
## 2018-04-01 ENCOUNTER — Encounter: Payer: Self-pay | Admitting: Emergency Medicine

## 2018-04-01 ENCOUNTER — Emergency Department
Admission: EM | Admit: 2018-04-01 | Discharge: 2018-04-01 | Disposition: A | Discharge status: Home / Self Care | Payer: BC Managed Care – PPO | Attending: Emergency Medicine | Admitting: Emergency Medicine

## 2018-04-01 ENCOUNTER — Emergency Department: Payer: BC Managed Care – PPO

## 2018-04-01 ENCOUNTER — Other Ambulatory Visit: Payer: Self-pay

## 2018-04-01 DIAGNOSIS — J45909 Unspecified asthma, uncomplicated: Secondary | ICD-10-CM | POA: Insufficient documentation

## 2018-04-01 DIAGNOSIS — G44209 Tension-type headache, unspecified, not intractable: Secondary | ICD-10-CM | POA: Diagnosis not present

## 2018-04-01 DIAGNOSIS — I10 Essential (primary) hypertension: Secondary | ICD-10-CM | POA: Insufficient documentation

## 2018-04-01 DIAGNOSIS — R0789 Other chest pain: Secondary | ICD-10-CM

## 2018-04-01 DIAGNOSIS — Z79899 Other long term (current) drug therapy: Secondary | ICD-10-CM | POA: Diagnosis not present

## 2018-04-01 DIAGNOSIS — R51 Headache: Secondary | ICD-10-CM | POA: Diagnosis present

## 2018-04-01 DIAGNOSIS — Z7901 Long term (current) use of anticoagulants: Secondary | ICD-10-CM | POA: Diagnosis not present

## 2018-04-01 LAB — CBC
HCT: 41.9 % (ref 36.0–46.0)
HEMOGLOBIN: 14.1 g/dL (ref 12.0–15.0)
MCH: 32 pg (ref 26.0–34.0)
MCHC: 33.7 g/dL (ref 30.0–36.0)
MCV: 95.2 fL (ref 80.0–100.0)
Platelets: 310 10*3/uL (ref 150–400)
RBC: 4.4 MIL/uL (ref 3.87–5.11)
RDW: 14.3 % (ref 11.5–15.5)
WBC: 7.9 10*3/uL (ref 4.0–10.5)
nRBC: 0 % (ref 0.0–0.2)

## 2018-04-01 LAB — BASIC METABOLIC PANEL
Anion gap: 8 (ref 5–15)
BUN: 17 mg/dL (ref 6–20)
CHLORIDE: 108 mmol/L (ref 98–111)
CO2: 24 mmol/L (ref 22–32)
Calcium: 9.2 mg/dL (ref 8.9–10.3)
Creatinine, Ser: 0.83 mg/dL (ref 0.44–1.00)
GFR calc Af Amer: >60 mL/min (ref >60)
GFR calc non Af Amer: >60 mL/min (ref >60)
Glucose, Bld: 92 mg/dL (ref 70–99)
POTASSIUM: 3.7 mmol/L (ref 3.5–5.1)
Sodium: 140 mmol/L (ref 135–145)

## 2018-04-01 LAB — TROPONIN I: Troponin I: <0.03 ng/mL (ref <0.03)

## 2018-04-01 MED ORDER — KETOROLAC TROMETHAMINE 30 MG/ML IJ SOLN
30.0000 mg | Freq: Once | INTRAMUSCULAR | Status: AC
  Administered 2018-04-01: 30 mg via INTRAMUSCULAR
  Filled 2018-04-01: qty 1

## 2018-04-01 NOTE — ED Triage Notes (Signed)
Pt presents with headache since Saturday, some blurred vision since early this morning, and chest pain since last night. She states she has had intermittent chest pain for a while. Pt states she had preeclampsia with her last pregnancy and has been on BP meds since then. Pt tearful during triage. NAD noted.

## 2018-04-01 NOTE — ED Notes (Signed)
Pt ambulatory to POV without difficulty. VSS. NAD. Discharge instructions,and follow up reviewed. All questions and concerns addressed.  

## 2018-04-01 NOTE — ED Provider Notes (Signed)
[facility] Emergency Department Provider Note   ____________________________________________    I have reviewed the triage vital signs and the nursing notes.   HISTORY  Chief Complaint Chest Pain and Headache     HPI Regina Garcia is a 37 y.o. female who presents with primary complaint of intermittent headache which is global and can be throbbing at times.  She sometimes feels pressure behind her eyes as well.  Currently her headache has improved.  She also describes that she has had intermittent chest tightness for many months.  She did have some of this today but it is now gone.  Denies shortness of breath to me.  No pleurisy.  No fevers chills or cough.  No nausea vomiting or diaphoresis.  No neuro deficits.  No change in vision.   Past Medical History:  Diagnosis Date  . Asthma     Patient Active Problem List   Diagnosis Date Noted  . Postpartum hypertension 02/21/2017    Past Surgical History:  Procedure Laterality Date  . CESAREAN SECTION      Prior to Admission medications   Medication Sig Start Date End Date Taking? Authorizing Provider  acetaminophen (TYLENOL) 325 MG tablet Take 975 mg by mouth every 6 (six) hours as needed for mild pain or headache.    [provider]  diazepam (VALIUM) 5 MG tablet Take 1 tablet (5 mg total) by mouth every 8 (eight) hours as needed for anxiety. 07/27/17 07/27/18  Rockne MenghiniNorman, Anne-Caroline, MD  enoxaparin (LOVENOX) 60 MG/0.6ML injection Inject 60 mg into the skin daily. 02/21/17   [provider]  hydrochlorothiazide (HYDRODIURIL) 12.5 MG tablet Take 12.5 mg by mouth daily. 02/20/17   [provider]  ibuprofen (ADVIL,MOTRIN) 800 MG tablet Take 1 tablet (800 mg total) by mouth every 8 (eight) hours as needed. 07/27/17   Rockne MenghiniNorman, Anne-Caroline, MD  NIFEdipine (PROCARDIA-XL/ADALAT CC) 60 MG 24 hr tablet Take 1 tablet (60 mg total) by mouth daily. 02/25/17   Ward, Elenora Fenderhelsea C, MD    oxyCODONE (OXY IR/ROXICODONE) 5 MG immediate release tablet Take 5 mg by mouth every 4 (four) hours as needed for moderate pain. 02/15/17   [provider]  oxyCODONE-acetaminophen (PERCOCET) 5-325 MG tablet Take 1 tablet by mouth every 4 (four) hours as needed for severe pain. 07/27/17   Rockne MenghiniNorman, Anne-Caroline, MD     Allergies Patient has no known allergies.  History reviewed. No pertinent family history.  Social History Social History   Tobacco Use  . Smoking status: Never Smoker  . Smokeless tobacco: Never Used  Substance Use Topics  . Alcohol use: No  . Drug use: No    Review of Systems  Constitutional: No fever/chills Eyes: No visual changes.  ENT: No sore throat. Cardiovascular: As above Respiratory: Denies shortness of breath. Gastrointestinal: No abdominal pain.  No nausea, no vomiting.   Genitourinary: Negative for dysuria. Musculoskeletal: Negative for back pain. Skin: Negative for rash. Neurological: As above   ____________________________________________   PHYSICAL EXAM:  VITAL SIGNS: ED Triage Vitals  Enc Vitals Group     BP 04/01/18 1840 (!) 189/106     Pulse Rate 04/01/18 1840 (!) 114     Resp 04/01/18 1840 20     Temp 04/01/18 1840 98.4 F (36.9 C)     Temp Source 04/01/18 1840 Oral     SpO2 04/01/18 1840 100 %     Weight 04/01/18 1841 136.1 kg (300 lb)     Height  04/01/18 1841 1.651 m (5\' 5" )     Head Circumference --      Peak Flow --      Pain Score 04/01/18 1840 7     Pain Loc --      Pain Edu? --      Excl. in GC? --     Constitutional: Alert and oriented. No acute distress.  Eyes: Conjunctivae are normal.  PERRLA, EOMI Head: Atraumatic. No lymphadenopathy Mouth/Throat: Mucous membranes are moist.   Neck:  Painless ROM Cardiovascular: Normal rate, regular rhythm. Grossly normal heart sounds.  Good peripheral circulation. Respiratory: Normal respiratory effort.  No retractions. Lungs CTAB. Gastrointestinal: Soft and  nontender. No distention.    Musculoskeletal: No lower extremity tenderness nor edema.  Warm and well perfused Neurologic:  Normal speech and language. No gross focal neurologic deficits are appreciated.  Skin:  Skin is warm, dry and intact. No rash noted. Psychiatric: Mood and affect are normal. Speech and behavior are normal.  ____________________________________________   LABS (all labs ordered are listed, but only abnormal results are displayed)  Labs Reviewed  BASIC METABOLIC PANEL  CBC  TROPONIN I  POC URINE PREG, ED   ____________________________________________  EKG  ED ECG REPORT I, Jene Every, the attending physician, personally viewed and interpreted this ECG.  Date: 04/01/2018  Rhythm: normal sinus rhythm QRS Axis: normal Intervals: normal ST/T Wave abnormalities: normal Narrative Interpretation: no evidence of acute ischemia  ____________________________________________  RADIOLOGY  Chest x-ray normal ____________________________________________   PROCEDURES  Procedure(s) performed: No  Procedures   Critical Care performed: No ____________________________________________   INITIAL IMPRESSION / ASSESSMENT AND PLAN / ED COURSE  Pertinent labs & imaging results that were available during my care of the patient were reviewed by me and considered in my medical decision making (see chart for details).  Patient well-appearing in no acute distress, exam is overall quite reassuring, mildly hypertensive but she reports her blood pressures are well controlled at home.  Troponin normal, chest x-ray normal, labs overall benign.  Suspect tension type headache, treated with IM Toradol with significant improvement.  Recommended cardiology follow-up, PCP follow-up.  Return precautions discussed    ____________________________________________   FINAL CLINICAL IMPRESSION(S) / ED DIAGNOSES  Final diagnoses:  Essential hypertension  Acute non intractable  tension-type headache  Atypical chest pain        Note:  This document was prepared using Dragon voice recognition software and may include unintentional dictation errors.    Jene Every, MD 04/01/18 2210

## 2019-12-30 IMAGING — CR DG CHEST 2V
1 series · 2 of 2 positions shown · non-contrast
Comparison: None.

CLINICAL DATA: Patient with shortness of breath and chest pain.

EXAM:
CHEST - 2 VIEW

[Series 1: w chest pa · 0.14mm/px · 2 of 2 slices shown]
[im 1/2]
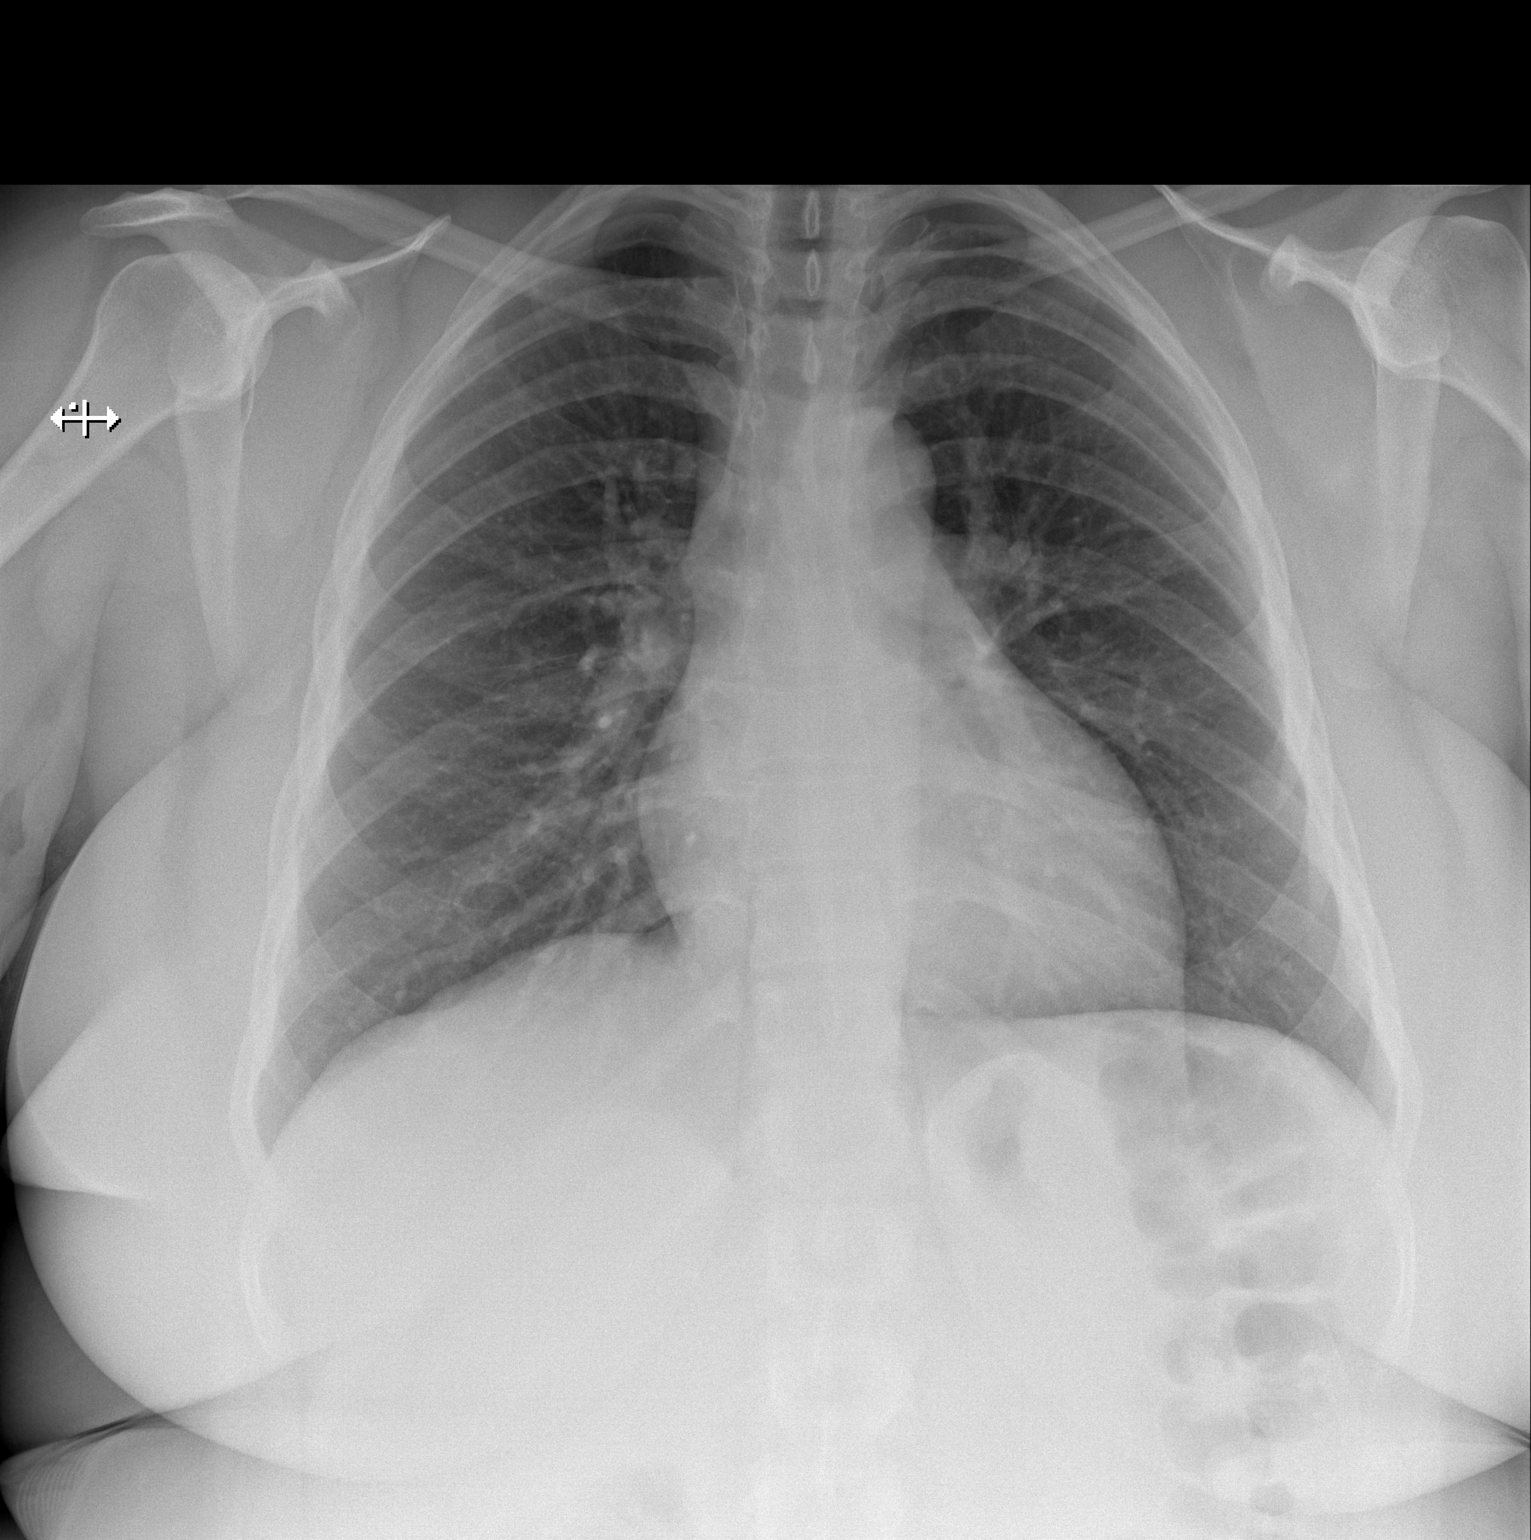
[im 2/2]
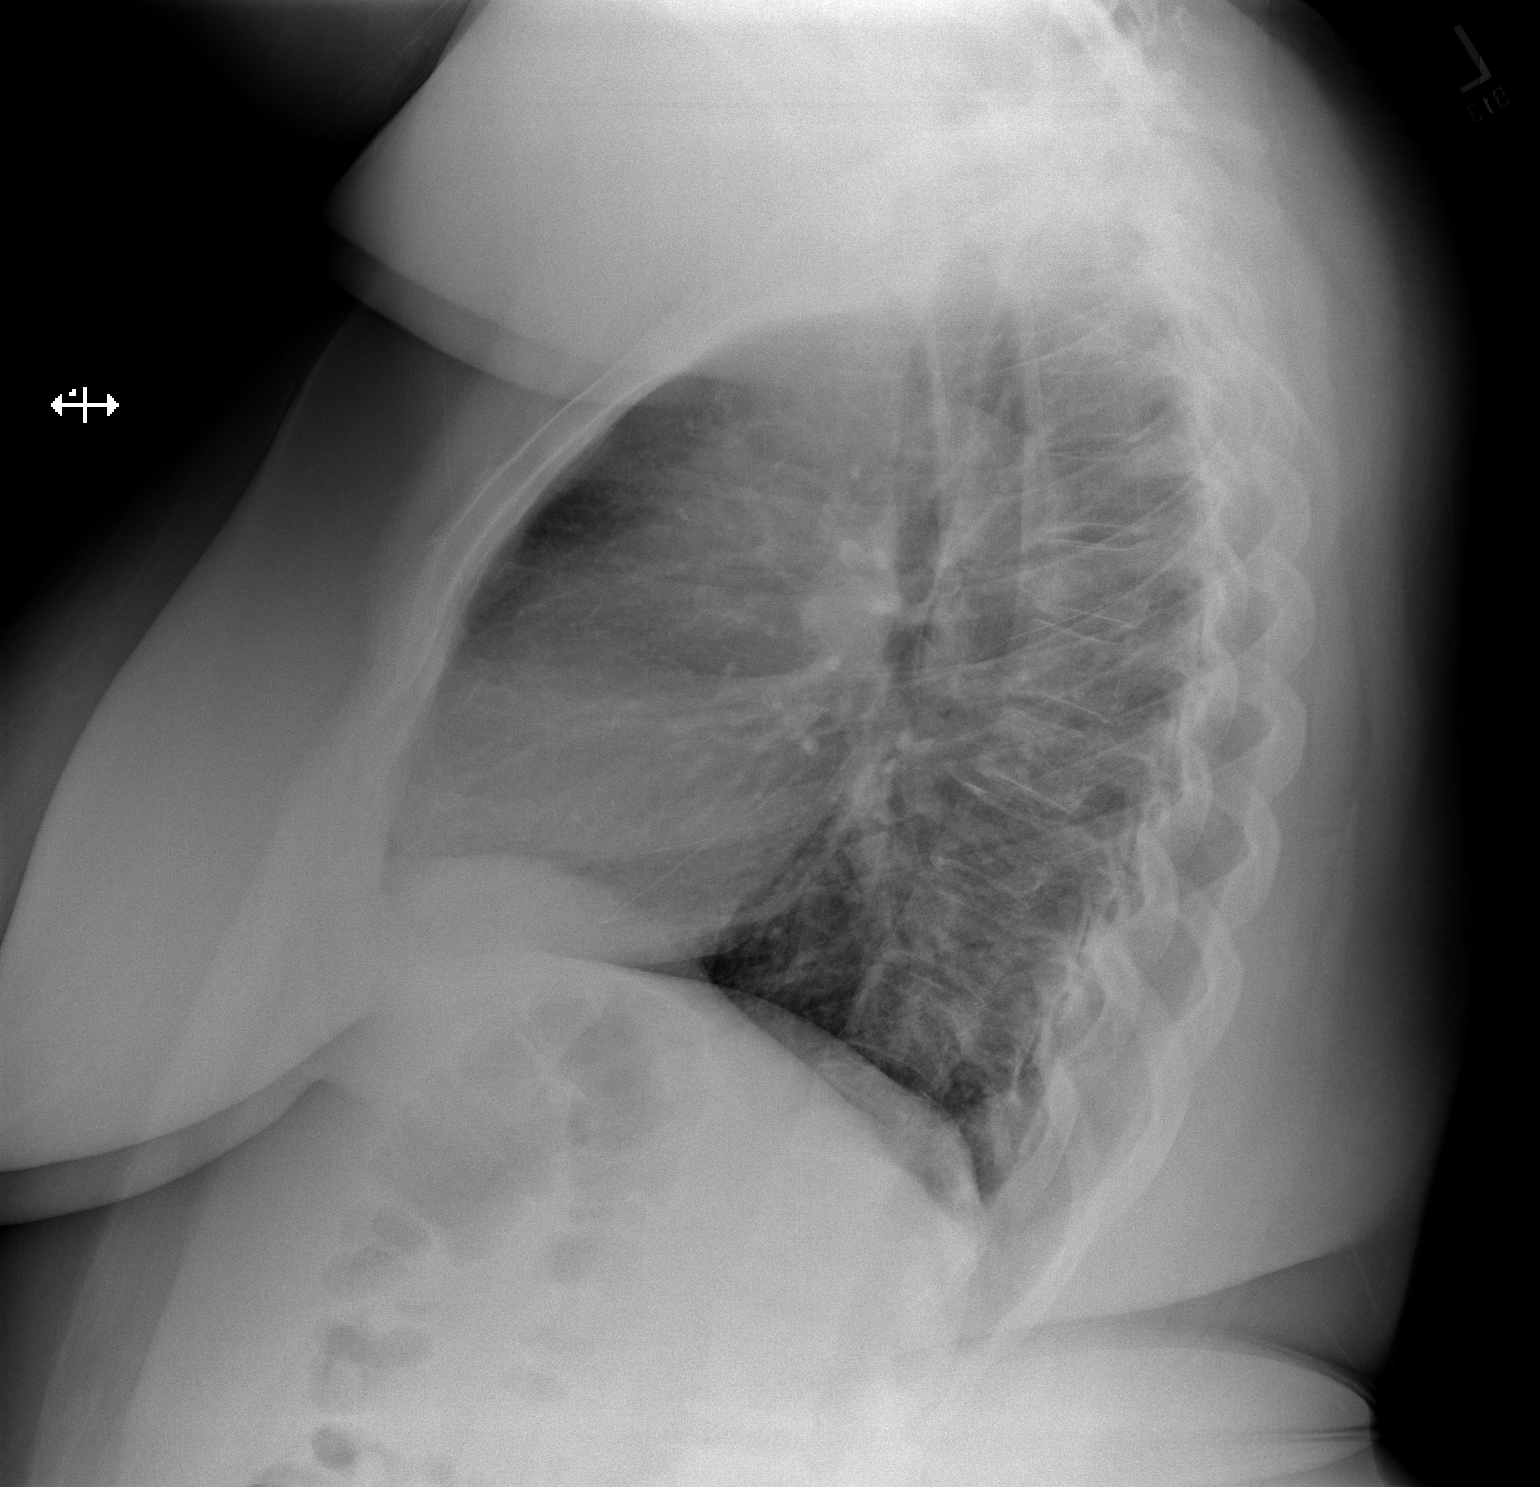

[2 of 2 positions shown; findings below may reference images not displayed]

FINDINGS: Normal cardiac and mediastinal contours. No consolidative pulmonary
opacities. No pleural effusion or pneumothorax. Regional skeleton is
unremarkable.
IMPRESSION: No acute cardiopulmonary process.

## 2021-03-08 ENCOUNTER — Ambulatory Visit: Admit: 2021-03-08 | Payer: BLUE CROSS/BLUE SHIELD | Attending: Internal Medicine | Primary: Internal Medicine

## 2021-03-08 MED ORDER — AMLODIPINE 5 MG TAB
5 mg | ORAL_TABLET | ORAL | 1 refills | Status: AC
Start: 2021-03-08 — End: ?

## 2021-03-08 NOTE — Progress Notes (Signed)
Labs look good

## 2021-03-08 NOTE — Progress Notes (Signed)
HISTORY OF PRESENT ILLNESS  Barbara Gray is a 40 y.o. female. Patient was seen to establish care. Moved up here from [place] a few months back. PMH of HTN.   Reports that after giving birth to her daughter she had a lot of trouble with her BP. Has been tried on several medications. Typically had a lot of fatigue and dizziness with them. Has been taking 50 mg of Losartan and 5 mg or Norvasc. Has not been taking her BP during this time because she was out of the medications.    Will need some lab work. Reports that she was being seen by her GYN for PAP. But has had ongoing itching to vaginal area for months. Nothing has really helped. But does become less. Can be internally and outside. No rash. Has been treat several times.   Mammogram done. Will ask for this from [facility].   Visit Vitals  BP 138/82 (BP 1 Location: Left upper arm, BP Patient Position: Sitting, BP Cuff Size: Large adult)   Pulse 90   Resp 12   Ht 5\' 5"  (1.651 m)   Wt 295 lb (133.8 kg)   LMP 02/15/2021 (Approximate)   SpO2 98%   BMI 49.09 kg/m??     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      amLODIPine (NORVASC) 5 mg tablet amlodipine 5 mg tablet  TAKE 1 TABLET BY MOUTH EVERY DAY 90 Tablet 1    [DISCONTINUED] amLODIPine (NORVASC) 5 mg tablet amlodipine 5 mg tablet   TAKE 1 TABLET BY MOUTH EVERY DAY       No facility-administered encounter medications on file as of 03/08/2021.     HPI    Review of Systems   Constitutional:  Negative for chills and fever.    Respiratory: Negative.     Cardiovascular: Negative.    Gastrointestinal: Negative.    Musculoskeletal: Negative.    Neurological: Negative.    Psychiatric/Behavioral: Negative.       Physical Exam  Vitals and nursing note reviewed.   Cardiovascular:      Rate and Rhythm: Normal rate and regular rhythm.   Pulmonary:      Effort: Pulmonary effort is normal.      Breath sounds: Normal breath sounds.   Abdominal:      General: Bowel sounds are normal.      Palpations: Abdomen is soft.   Musculoskeletal:      Cervical back: Neck supple.      Right lower leg: No edema.      Left lower leg: No edema.   Skin:     General: Skin is warm.   Neurological:      Mental Status: She is alert and oriented to person, place, and time.   Psychiatric:         Behavior: Behavior normal.  ASSESSMENT and PLAN  Diagnoses and all orders for this visit:    1. Encounter for medical examination to establish care  -     METABOLIC PANEL, COMPREHENSIVE; Future  -     CBC WITH AUTOMATED DIFF; Future  -     TSH 3RD GENERATION; Future    2. Primary hypertension  -     amLODIPine (NORVASC) 5 mg tablet; amlodipine 5 mg tablet  TAKE 1 TABLET BY MOUTH EVERY DAY        -     DASH diet. BP goal of below 140. Suggested the BP cuff that was upper arm and reviewed how to take BP.   3. Lipid screening  -     LIPID PANEL; Future    4. Encounter for immunization  -     INFLUENZA, FLUARIX, FLULAVAL, FLUZONE (AGE 64 MO+), AFLURIA(AGE 3Y+) IM, PF, 0.5 ML      Follow-up and Dispositions    Return in about 6 months (around 09/05/2021), or if symptoms worsen or fail to improve.       lab results and schedule of future lab studies reviewed with patient  reviewed diet, exercise and weight control  cardiovascular risk and specific lipid/LDL goals reviewed  reviewed medications and side effects in detail

## 2021-03-08 NOTE — Progress Notes (Signed)
 ADVISED PATIENT OF THE FOLLOWING HEALTH MAINTAINCE DUE  Health Maintenance Due   Topic Date Due    Hepatitis C Screening  Never done    Depression Screen  Never done    Cervical cancer screen  Never done    Lipid Screen  Never done    COVID-19 Vaccine (4 - Booster for Pfizer series) 05/26/2020    Flu Vaccine (1) 11/29/2020      Chief Complaint   Patient presents with    Establish Care    Physical    Labs    Medication Refill       1. Have you been to the ER, urgent care clinic since your last visit?  Hospitalized since your last visit? No    2. Have you seen or consulted any other health care providers outside of the [facility] since your last visit? No     3. For patients aged 75-75: Has the patient had a colonoscopy / FIT/ Cologuard? NA - based on age      If the patient is female:    4. For patients aged 71-74: Has the patient had a mammogram within the past 2 years? Yes - Care Gap present. Rooming MA/LPN to request most recent results      5. For patients aged 21-65: Has the patient had a pap smear? MA will research records

## 2021-03-09 LAB — COMPREHENSIVE METABOLIC PANEL
ALT: 10 IU/L (ref 0–32)
AST: 16 IU/L (ref 0–40)
Albumin/Globulin Ratio: 1.6 NA (ref 1.2–2.2)
Albumin: 4.5 g/dL (ref 3.8–4.8)
Alkaline Phosphatase: 97 IU/L (ref 44–121)
BUN: 12 mg/dL (ref 6–24)
Bun/Cre Ratio: 13 NA (ref 9–23)
CO2: 23 mmol/L (ref 20–29)
Calcium: 9.2 mg/dL (ref 8.7–10.2)
Chloride: 103 mmol/L (ref 96–106)
Creatinine: 0.94 mg/dL (ref 0.57–1.00)
Est, Glomerular Filtration Rate: 78 mL/min/{1.73_m2} (ref >59)
Globulin, Total: 2.9 g/dL (ref 1.5–4.5)
Glucose: 83 mg/dL (ref 70–99)
Potassium: 3.9 mmol/L (ref 3.5–5.2)
Sodium: 138 mmol/L (ref 134–144)
Total Bilirubin: 0.3 mg/dL (ref 0.0–1.2)
Total Protein: 7.4 g/dL (ref 6.0–8.5)

## 2021-03-09 LAB — CVD REPORT

## 2021-03-09 LAB — CBC WITH AUTO DIFFERENTIAL
Basophils %: 0 %
Basophils Absolute: 0 10*3/uL (ref 0.0–0.2)
Eosinophils %: 1 %
Eosinophils Absolute: 0 10*3/uL (ref 0.0–0.4)
Granulocyte Absolute Count: 0 10*3/uL (ref 0.0–0.1)
Hematocrit: 39.1 % (ref 34.0–46.6)
Hemoglobin: 13.5 g/dL (ref 11.1–15.9)
Immature Granulocytes: 0 %
Lymphocytes %: 46 %
Lymphocytes Absolute: 2.2 10*3/uL (ref 0.7–3.1)
MCH: 32.1 pg (ref 26.6–33.0)
MCHC: 34.5 g/dL (ref 31.5–35.7)
MCV: 93 fL (ref 79–97)
Monocytes %: 8 %
Monocytes Absolute: 0.4 10*3/uL (ref 0.1–0.9)
Neutrophils %: 45 %
Neutrophils Absolute: 2.1 10*3/uL (ref 1.4–7.0)
Platelets: 264 10*3/uL (ref 150–450)
RBC: 4.2 x10E6/uL (ref 3.77–5.28)
RDW: 13 % (ref 11.7–15.4)
WBC: 4.7 10*3/uL (ref 3.4–10.8)

## 2021-03-09 LAB — LIPID PANEL
Cholesterol, Total: 170 mg/dL (ref 100–199)
Cholesterol, total: 170 mg/dL (ref 100–199)
HDL Cholesterol: 71 mg/dL (ref >39)
HDL: 71 mg/dL (ref >39)
LDL Calculated: 89 mg/dL (ref 0–99)
LDL, calculated: 89 mg/dL (ref 0–99)
Triglyceride: 48 mg/dL (ref 0–149)
Triglycerides: 48 mg/dL (ref 0–149)
VLDL, calculated: 10 mg/dL (ref 5–40)
VLDL: 10 mg/dL (ref 5–40)

## 2021-03-09 LAB — TSH 3RD GENERATION
TSH: 2.42 u[IU]/mL (ref 0.450–4.500)
TSH: 2.42 u[IU]/mL (ref 0.450–4.500)

## 2021-03-09 LAB — METABOLIC PANEL, COMPREHENSIVE
A-G Ratio: 1.6 (ref 1.2–2.2)
ALT (SGPT): 10 IU/L (ref 0–32)
AST (SGOT): 16 IU/L (ref 0–40)
Albumin: 4.5 g/dL (ref 3.8–4.8)
Alk. phosphatase: 97 IU/L (ref 44–121)
BUN/Creatinine ratio: 13 (ref 9–23)
BUN: 12 mg/dL (ref 6–24)
Bilirubin, total: 0.3 mg/dL (ref 0.0–1.2)
CO2: 23 mmol/L (ref 20–29)
Calcium: 9.2 mg/dL (ref 8.7–10.2)
Chloride: 103 mmol/L (ref 96–106)
Creatinine: 0.94 mg/dL (ref 0.57–1.00)
GLOBULIN, TOTAL: 2.9 g/dL (ref 1.5–4.5)
Glucose: 83 mg/dL (ref 70–99)
Potassium: 3.9 mmol/L (ref 3.5–5.2)
Protein, total: 7.4 g/dL (ref 6.0–8.5)
Sodium: 138 mmol/L (ref 134–144)
eGFR: 78 mL/min/{1.73_m2} (ref >59)

## 2021-03-09 LAB — CBC WITH AUTOMATED DIFF
ABS. BASOPHILS: 0 10*3/uL (ref 0.0–0.2)
ABS. EOSINOPHILS: 0 10*3/uL (ref 0.0–0.4)
ABS. IMM. GRANS.: 0 10*3/uL (ref 0.0–0.1)
ABS. MONOCYTES: 0.4 10*3/uL (ref 0.1–0.9)
ABS. NEUTROPHILS: 2.1 10*3/uL (ref 1.4–7.0)
Abs Lymphocytes: 2.2 10*3/uL (ref 0.7–3.1)
BASOPHILS: 0 %
EOSINOPHILS: 1 %
HCT: 39.1 % (ref 34.0–46.6)
HGB: 13.5 g/dL (ref 11.1–15.9)
IMMATURE GRANULOCYTES: 0 %
Lymphocytes: 46 %
MCH: 32.1 pg (ref 26.6–33.0)
MCHC: 34.5 g/dL (ref 31.5–35.7)
MCV: 93 fL (ref 79–97)
MONOCYTES: 8 %
NEUTROPHILS: 45 %
PLATELET: 264 10*3/uL (ref 150–450)
RBC: 4.2 x10E6/uL (ref 3.77–5.28)
RDW: 13 % (ref 11.7–15.4)
WBC: 4.7 10*3/uL (ref 3.4–10.8)

## 2021-03-18 ENCOUNTER — Ambulatory Visit: Attending: Internal Medicine | Primary: Internal Medicine

## 2021-06-30 ENCOUNTER — Ambulatory Visit
Admit: 2021-06-30 | Discharge: 2021-06-30 | Payer: BLUE CROSS/BLUE SHIELD | Attending: Internal Medicine | Primary: Internal Medicine

## 2021-06-30 ENCOUNTER — Ambulatory Visit: Attending: Internal Medicine | Primary: Internal Medicine

## 2021-06-30 DIAGNOSIS — B351 Tinea unguium: Secondary | ICD-10-CM

## 2021-06-30 MED ORDER — TRAZODONE 100 MG TAB
100 mg | ORAL_TABLET | Freq: Every evening | ORAL | 0 refills | Status: AC
Start: 2021-06-30 — End: ?

## 2021-06-30 MED ORDER — LOSARTAN 50 MG TAB
50 mg | ORAL_TABLET | Freq: Every day | ORAL | 1 refills | Status: AC
Start: 2021-06-30 — End: ?

## 2021-06-30 NOTE — Progress Notes (Signed)
HISTORY OF PRESENT ILLNESS  Barbara Gray is a 41 y.o. female. Patient was seen for a follow up.   Reports that her BP has been better. Asking to continue to reduce her losartan to 50 mg. Is on Norvasc 5 mg.   Reports that she just recently got divorced so this has been a huge effect on her sleeping. Is trying to work on her weight and making better choices.   Has been having thick nails and fungal like area to the great toes for over 2 years. It appeared to get better, but is now worsening. Reports some pain to the toenail. No swelling or redness.   Nail Problem  Visit Vitals  BP 130/84 (BP 1 Location: Right arm, BP Patient Position: Sitting, BP Cuff Size: Adult)   Pulse 85   Temp 97.5 ??F (36.4 ??C) (Oral)   Resp 18   Ht 5\' 5"  (1.651 m)   Wt 300 lb 12.8 oz (136.4 kg)   LMP 06/20/2021 (Approximate)   SpO2 98%   BMI 50.06 kg/m??     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 90 Tablet 1    traZODone (DESYREL) 100 mg tablet Take 1 Tablet by mouth nightly. 90 Tablet 0    amLODIPine (NORVASC) 5 mg tablet amlodipine 5 mg tablet  TAKE 1 TABLET BY MOUTH EVERY DAY 90 Tablet 1    [DISCONTINUED] losartan (COZAAR) 100 mg tablet Take 100 mg by mouth daily. Take 0.5 tablet every daily       No facility-administered encounter medications on file as of 06/30/2021.       Review of Systems   Constitutional: Negative.    Respiratory: Negative.      Cardiovascular: Negative.    Gastrointestinal: Negative.    Psychiatric/Behavioral:  The patient has insomnia.      Physical Exam  Vitals and nursing note reviewed.   Constitutional:       Appearance: She is obese.   Cardiovascular:      Rate and Rhythm: Normal rate and regular rhythm.   Pulmonary:      Effort: Pulmonary effort is normal.      Breath sounds: Normal breath sounds.   Abdominal:      Palpations: Abdomen is soft.   Feet:      Right foot:      Toenail Condition: Fungal disease present.     Left foot:      Toenail Condition: Left toenails are abnormally thick. Fungal disease present.  Skin:     General: Skin is warm.   Neurological:      Mental Status: She is alert and oriented to person, place, and time.   Psychiatric:         Mood and  Affect: Affect is tearful.       ASSESSMENT and PLAN  Diagnoses and all orders for this visit:    1. Toenail fungus  -     REFERRAL TO PODIATRY    2. Overgrown toenails  -     REFERRAL TO PODIATRY    3. Primary hypertension  -     losartan (COZAAR) 50 mg tablet; Take 1 Tablet by mouth daily.    4. Adjustment insomnia  -     traZODone (DESYREL) 100 mg tablet; Take 1 Tablet by mouth nightly.      Follow-up and Dispositions    Return in about 6 months (around 12/31/2021), or if symptoms worsen or fail to improve.       lab results and schedule of future lab studies reviewed with patient  reviewed diet, exercise and weight control  reviewed medications and side effects in detail

## 2021-06-30 NOTE — Progress Notes (Signed)
 Verified Name and DOB of the patient.    Chief Complaint   Patient presents with    Nail Problem     Pt stated for over a year left  greater toe started to become black now transiting to right greater toe. Both toes are causing pain and discomfort.        Health maintenance will be addressed with Primary Care Provider at next visit.       Vitals:    06/30/21 1519   BP: 130/84   Pulse: 85   Resp: 18   Temp: 97.5 F (36.4 C)   TempSrc: Oral   SpO2: 98%   Weight: 300 lb 12.8 oz (136.4 kg)   Height: 5' 5 (1.651 m)   PainSc:   4   PainLoc: Toe   LMP: 06/20/2021       1. Have you been to the ER, urgent care clinic since your last visit?  Hospitalized since your last visit?No    2. Have you seen or consulted any other health care providers outside of the [facility] since your last visit?  Include any pap smears or colon screening. Yes. OBGYN December 2022. Doctor  Clearance

## 2021-10-09 ENCOUNTER — Encounter

## 2021-10-10 ENCOUNTER — Encounter

## 2021-10-10 MED ORDER — TRAZODONE HCL 100 MG PO TABS
100 MG | ORAL_TABLET | Freq: Every evening | ORAL | 1 refills | Status: DC
Start: 2021-10-10 — End: 2021-10-25

## 2021-10-10 MED ORDER — AMLODIPINE BESYLATE 5 MG PO TABS
5 MG | ORAL_TABLET | ORAL | 1 refills | Status: DC
Start: 2021-10-10 — End: 2022-04-19

## 2021-10-10 NOTE — Telephone Encounter (Signed)
03.02.23  No upcoming

## 2021-10-25 ENCOUNTER — Ambulatory Visit
Admit: 2021-10-25 | Discharge: 2021-10-25 | Payer: BLUE CROSS/BLUE SHIELD | Attending: Internal Medicine | Primary: Internal Medicine

## 2021-10-25 DIAGNOSIS — Z1231 Encounter for screening mammogram for malignant neoplasm of breast: Secondary | ICD-10-CM

## 2021-10-25 MED ORDER — ZOLPIDEM TARTRATE 5 MG PO TABS
5 MG | ORAL_TABLET | Freq: Every evening | ORAL | 0 refills | Status: DC | PRN
Start: 2021-10-25 — End: 2022-03-03

## 2021-10-25 NOTE — Progress Notes (Signed)
1. "Have you been to the ER, urgent care clinic since your last visit?  Hospitalized since your last visit?" No    2. "Have you seen or consulted any other health care providers outside of the [facility] since your last visit?" No    3. For patients aged 42-75: Has the patient had a colonoscopy / FIT/ Cologuard? Recommendation: Colonoscopy every 10y or annual FIT test from 50-75 or every 3 year stool DNA based test with consideration of ongoing screening from 76-85. and No      If the patient is female:    4. For patients aged 22-74: Has the patient had a mammogram within the past 2 years? Yes    5. For patients aged 21-65: Has the patient had a pap smear? Yes'

## 2021-10-25 NOTE — Progress Notes (Signed)
Subjective    Summar Barbara Gray is a 41 y.o. female who presents today for the following: patient was seen for a follow up.   Reports that she will have to go to [place] to care for hew mom for a few months. Will need refills at this time.   Reports that the trazodone was helping with sleep, but would have tiredness lingering for hours in the AM. Was taking this at 9pm or earlier each night. Has been on Ambien in the past. May want to try this again. Does not need something daily.   Also once she stopped her medication her hair growth returned. Has been having some concerns with weight loss. Also notes for years she has had vaginal itching. This also increases when she is mentrating. No pain, or discharge. Has seen her previous GYN but none could have answers. Does hold a lot of moisture to the area. No rash  Chief Complaint   Patient presents with    Annual Exam           PMH/PSH/Allergies/Social History/medication list and most recent studies reviewed with patient.     reports that she has never smoked. She has never used smokeless tobacco.    reports current alcohol use of about 1.0 - 2.0 standard drink per week.     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           Review of Systems   Constitutional: Negative.    Respiratory: Negative.     Cardiovascular: Negative.    Gastrointestinal: Negative.    Genitourinary:  Negative for  difficulty urinating, menstrual problem, vaginal bleeding, vaginal discharge and vaginal pain.        Vaginal itching    Musculoskeletal: Negative.    Neurological: Negative.    Psychiatric/Behavioral:  Positive for sleep disturbance.        Objective    Physical Exam  Vitals and nursing note reviewed. Exam conducted with a chaperone present.   Constitutional:       Appearance: She is obese.   Cardiovascular:      Rate and Rhythm: Normal rate and regular rhythm.   Pulmonary:      Effort: Pulmonary effort is normal.      Breath sounds: Normal breath sounds.   Abdominal:      Palpations: Abdomen is soft.   Genitourinary:     Comments: No rash present to vaginal area  Skin:     General: Skin is warm.   Neurological:      Mental Status: She is alert and oriented to person, place, and time.   Psychiatric:         Behavior:  Behavior normal.          Assessment & Plan      Barbara Gray was seen today for annual exam.    Diagnoses and all orders for this visit:    Breast cancer screening by mammogram  -     MAM TOMO DIGITAL DIAGNOSTIC BILATERAL; Future    Abnormal facial hair  -     AFL - Mason Jim, MD, Ob-Gyn, [place] ([address])    Weight gain  -     AFL - Mason Jim, MD, Ob-Gyn, [place] ([address])    Adjustment insomnia  -     zolpidem (AMBIEN) 5 MG tablet; Take 1 tablet by mouth nightly as needed for Sleep for up to 30 days. Max Daily Amount: 5 mg    Primary hypertension      Reviewed with patient medication side effects in detail   I answered all patient questions and concerns  Labs and testing done and/or upcoming labs/test were reviewed and discussed   Reviewed and discussed daily activity, exercise and diet      Return in about 6 months (around 04/26/2022) for follow up for routine visit or before if needed.     Maree Erie, APRN - NP

## 2021-11-22 ENCOUNTER — Inpatient Hospital Stay: Admit: 2021-11-22 | Payer: BLUE CROSS/BLUE SHIELD | Primary: Internal Medicine

## 2021-11-22 ENCOUNTER — Encounter

## 2021-11-22 DIAGNOSIS — Z1231 Encounter for screening mammogram for malignant neoplasm of breast: Secondary | ICD-10-CM

## 2021-12-29 ENCOUNTER — Encounter

## 2021-12-29 MED ORDER — ZOLPIDEM TARTRATE 5 MG PO TABS
5 MG | ORAL_TABLET | Freq: Every evening | ORAL | 0 refills | Status: AC | PRN
Start: 2021-12-29 — End: 2022-01-28

## 2021-12-29 NOTE — Telephone Encounter (Signed)
Lov: 10/25/21  No upcoming appointments    Pt requesting a refill on Ambien to be sent to [facility] in [place]  Pt states she is there temporarily it is not her permanent residence

## 2021-12-29 NOTE — Telephone Encounter (Signed)
Requested Prescriptions     Pending Prescriptions Disp Refills    zolpidem (AMBIEN) 5 MG tablet 14 tablet 0     Sig: Take 1 tablet by mouth nightly as needed for Sleep for up to 14 days. Max Daily Amount: 5 mg         [id number] - [place], [place] - [address] - P [phone] Carmon Ginsberg [id number]  [address]  [place] [address]  Phone: [phone] Fax: [phone]       Last appt 10/25/2021      No future appointments.

## 2022-01-12 ENCOUNTER — Encounter

## 2022-01-12 MED ORDER — LOSARTAN POTASSIUM 50 MG PO TABS
50 MG | ORAL_TABLET | Freq: Every day | ORAL | 1 refills | Status: AC
Start: 2022-01-12 — End: ?

## 2022-03-03 ENCOUNTER — Encounter

## 2022-03-03 MED ORDER — ZOLPIDEM TARTRATE 5 MG PO TABS
5 MG | ORAL_TABLET | Freq: Every evening | ORAL | 0 refills | Status: AC | PRN
Start: 2022-03-03 — End: 2022-04-02

## 2022-03-03 NOTE — Telephone Encounter (Signed)
10/25/2021  No upcoming appointment

## 2022-04-19 ENCOUNTER — Encounter

## 2022-04-19 MED ORDER — AMLODIPINE BESYLATE 5 MG PO TABS
5 MG | ORAL_TABLET | ORAL | 1 refills | Status: AC
Start: 2022-04-19 — End: ?

## 2022-04-19 NOTE — Telephone Encounter (Signed)
06.27.23  No upcoming
